# Patient Record
Sex: Male | Born: 1975 | Race: White | Hispanic: No | Marital: Single
Health system: Southern US, Community
[De-identification: ages and names within clinical notes are randomized; demographics above are authoritative.]

---

## 2019-06-09 ENCOUNTER — Other Ambulatory Visit: Payer: Self-pay | Admitting: Family Medicine

## 2019-06-09 DIAGNOSIS — K651 Peritoneal abscess: Secondary | ICD-10-CM

## 2019-06-22 ENCOUNTER — Ambulatory Visit
Admission: RE | Admit: 2019-06-22 | Discharge: 2019-06-22 | Disposition: A | Discharge status: Home / Self Care | Payer: No Typology Code available for payment source | Source: Ambulatory Visit | Attending: Family Medicine | Admitting: Family Medicine

## 2019-06-22 ENCOUNTER — Encounter: Payer: Self-pay | Admitting: Radiology

## 2019-06-22 ENCOUNTER — Other Ambulatory Visit: Payer: Self-pay | Admitting: Family Medicine

## 2019-06-22 DIAGNOSIS — K651 Peritoneal abscess: Secondary | ICD-10-CM

## 2019-06-22 HISTORY — PX: IR RADIOLOGIST EVAL & MGMT: IMG5224

## 2019-06-22 MED ORDER — IOPAMIDOL (ISOVUE-300) INJECTION 61%
125.0000 mL | Freq: Once | INTRAVENOUS | Status: AC | PRN
  Administered 2019-06-22: 12:00:00 125 mL via INTRAVENOUS

## 2019-06-22 NOTE — Progress Notes (Signed)
Chief Complaint: Patient was seen in consultation today for abscess follow up at the request of McRoberts,Deborah  Referring Physician(s): McRoberts,Deborah  History of Present Illness: Randall Neal is a 43 y.o. male with diverticular abscess s/p perc drain placement a couple weeks ago at [facility]. He has been keeping track of his output, which has only been about 5 mL per day. He finishes his abx today. Denies fevers, chills, abd pain. States has good appetite and good BMs. He is here today for follow up CT and drain injection.  No past medical history on file.   Allergies: Patient has no allergy information on record.  Medications: Prior to Admission medications   Not on File     No family history on file.  Social History   Socioeconomic History  . Marital status: Single    Spouse name: Not on file  . Number of children: Not on file  . Years of education: Not on file  . Highest education level: Not on file  Occupational History  . Not on file  Social Needs  . Financial resource strain: Not on file  . Food insecurity    Worry: Not on file    Inability: Not on file  . Transportation needs    Medical: Not on file    Non-medical: Not on file  Tobacco Use  . Smoking status: Not on file  Substance and Sexual Activity  . Alcohol use: Not on file  . Drug use: Not on file  . Sexual activity: Not on file  Lifestyle  . Physical activity    Days per week: Not on file    Minutes per session: Not on file  . Stress: Not on file  Relationships  . Social Musicianconnections    Talks on phone: Not on file    Gets together: Not on file    Attends religious service: Not on file    Active member of club or organization: Not on file    Attends meetings of clubs or organizations: Not on file    Relationship status: Not on file  Other Topics Concern  . Not on file  Social History Narrative  . Not on file     Review of Systems: A 12 point ROS discussed and pertinent  positives are indicated in the HPI above.  All other systems are negative.  Review of Systems  Vital Signs: There were no vitals taken for this visit.  Physical Exam HENT:     Head: Normocephalic.  Cardiovascular:     Rate and Rhythm: Normal rate and regular rhythm.     Heart sounds: Normal heart sounds.  Pulmonary:     Effort: Pulmonary effort is normal. No respiratory distress.     Breath sounds: Normal breath sounds.  Abdominal:     General: Abdomen is flat.     Palpations: Abdomen is soft.     Comments: RLQ drain intact, site clean, dry  Neurological:     Mental Status: He is alert.  Psychiatric:        Mood and Affect: Mood normal.        Judgment: Judgment normal.      Imaging:  CT shows essential resolution of abscess cavity, see full report. 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 Drain needs to remain. Switch to gravity bag instead of suction. Continue once daily gentle flush 5 mL  only. Will set him up for 2 week follow up drain injection only. Does not need repeat CT unless something changes clinically. He needs to see his PCP who can refer him for both GI and Surgery consults.  Thank you for this interesting consult.  I greatly enjoyed meeting Randall Neal and look forward to participating in their care.  A copy of this report was sent to the requesting provider on this date.  Electronically Signed: Ascencion Dike 06/22/2019, 1:33 PM   I spent a total of 25 minutes in face to face in clinical consultation, greater than 50% of which was counseling/coordinating care for abscess drain

## 2019-07-06 ENCOUNTER — Other Ambulatory Visit: Payer: Self-pay | Admitting: Family Medicine

## 2019-07-06 ENCOUNTER — Ambulatory Visit
Admission: RE | Admit: 2019-07-06 | Discharge: 2019-07-06 | Disposition: A | Discharge status: Home / Self Care | Payer: No Typology Code available for payment source | Source: Ambulatory Visit | Attending: Family Medicine | Admitting: Family Medicine

## 2019-07-06 ENCOUNTER — Encounter: Payer: Self-pay | Admitting: Radiology

## 2019-07-06 DIAGNOSIS — K651 Peritoneal abscess: Secondary | ICD-10-CM

## 2019-07-06 HISTORY — PX: IR RADIOLOGIST EVAL & MGMT: IMG5224

## 2019-07-06 NOTE — Progress Notes (Signed)
Referring Physician(s): McRoberts,Deborah Dr Forrest MoronStephen Ruehle  Chief Complaint: The patient is seen in follow up today s/p 06/07/19:  Successful CT-guided diverticular abscess drain placement   History of present illness:  Lower abdominal diverticular abscess Abscess drain in place  9/1 injection: Fistula from the level of the percutaneous drainage catheter to the lumen of the mid sigmoid colon.  OP very little; odorous yellow fluid flushes 2x/day-- 5 cc each time Denies N/V Denies fever/chills Denies pain Eating well  Pt seeing Dr Noah DelaineMcRoberts and Dr Levora Angeluehle Has not been evaluated by GI or Surgeon at this point  Scheduled today for injection only And plan for management  No past medical history on file.  Allergies: Patient has no allergy information on record.  Medications: Prior to Admission medications   Not on File     No family history on file.  Social History   Socioeconomic History  . Marital status: Single    Spouse name: Not on file  . Number of children: Not on file  . Years of education: Not on file  . Highest education level: Not on file  Occupational History  . Not on file  Social Needs  . Financial resource strain: Not on file  . Food insecurity    Worry: Not on file    Inability: Not on file  . Transportation needs    Medical: Not on file    Non-medical: Not on file  Tobacco Use  . Smoking status: Not on file  Substance and Sexual Activity  . Alcohol use: Not on file  . Drug use: Not on file  . Sexual activity: Not on file  Lifestyle  . Physical activity    Days per week: Not on file    Minutes per session: Not on file  . Stress: Not on file  Relationships  . Social Musicianconnections    Talks on phone: Not on file    Gets together: Not on file    Attends religious service: Not on file    Active member of club or organization: Not on file    Attends meetings of clubs or organizations: Not on file    Relationship status: Not on file   Other Topics Concern  . Not on file  Social History Narrative  . Not on file     Vital Signs: BP (!) 148/77   Pulse (!) 104   Temp 98.2 F (36.8 C)   SpO2 100%    Physical Exam Vitals signs reviewed.  Skin:    General: Skin is warm and dry.     Comments: Site is clean and dry NT no bleeding Injection reveals ++ fistula to bowel per Dr Loreta AveWagner  Neurological:     Mental Status: He is alert.   Imaging: Ir Radiologist Eval & Mgmt  Result Date: 07/06/2019 Please refer to notes tab for details about interventional procedure. (Op Note)   Labs:  CBC: No results for input(s): WBC, HGB, HCT, PLT in the last 8760 hours.  COAGS: No results for input(s): INR, APTT in the last 8760 hours.  BMP: No results for input(s): NA, K, CL, CO2, GLUCOSE, BUN, CALCIUM, CREATININE, GFRNONAA, GFRAA in the last 8760 hours.  Invalid input(s): CMP  LIVER FUNCTION TESTS: No results for input(s): BILITOT, AST, ALT, ALKPHOS, PROT, ALBUMIN in the last 8760 hours.  Assessment:  Diverticular abscess Injection today does reveal + fistula to bowel Plan: stop flushes Keep drain to gravity bag Need to get referral for GI evaluation  and Surgery evaluation Will send note to Dr Jabier Mutton and Dr Clyde Lundborg Pt has good understanding of plan Dr Earleen Newport spent several minutes with pt in explanation  Return 4 weeks for Injection  Signed: Lavonia Drafts, PA-C 07/06/2019, 2:55 PM   Please refer to Dr. Earleen Newport attestation of this note for management and plan.

## 2019-08-03 ENCOUNTER — Encounter: Payer: Self-pay | Admitting: Radiology

## 2019-08-03 ENCOUNTER — Ambulatory Visit
Admission: RE | Admit: 2019-08-03 | Discharge: 2019-08-03 | Disposition: A | Discharge status: Home / Self Care | Payer: No Typology Code available for payment source | Source: Ambulatory Visit | Attending: Family Medicine | Admitting: Family Medicine

## 2019-08-03 DIAGNOSIS — K651 Peritoneal abscess: Secondary | ICD-10-CM

## 2019-08-03 HISTORY — PX: IR RADIOLOGIST EVAL & MGMT: IMG5224

## 2019-08-03 NOTE — Progress Notes (Signed)
Chief Complaint: Status post percutaneous catheter drainage of sigmoid diverticular abscess.  History of Present Illness: Randall Neal is a 43 y.o. male status post percutaneous catheter drainage of sigmoid diverticular abscess on 06/07/2019.  Since that time 2 separate fluoroscopic contrast injection procedures have demonstrated a persistent fistula to the lumen of the adjacent sigmoid colon.  He did have some pain recently and had a CT of the abdomen and pelvis performed on 07/31/2019 at [facility] demonstrating resolution of the abscess and no new acute findings with stable positioning of the percutaneous drain.  Pain has since resolved.  Randall Neal is on a normal diet and having normal bowel movements.  He denies fever.  He is no longer on antibiotics.  Output from the drain is approximately 3 mL of fairly clear fluid every 1 to 2 days.  The drainage catheter is to a gravity bag and is not being flushed since his last drain injection 4 weeks ago.  He never saw a general surgeon in follow-up after discharge from the hospital.  He is scheduled to see Dr. Conley Rolls tomorrow for initial GI consultation.  No past medical history on file.   Allergies: Patient has no allergy information on record.  Medications: Prior to Admission medications   Not on File     No family history on file.  Social History   Socioeconomic History   Marital status: Single    Spouse name: Not on file   Number of children: Not on file   Years of education: Not on file   Highest education level: Not on file  Occupational History   Not on file  Social Needs   Financial resource strain: Not on file   Food insecurity    Worry: Not on file    Inability: Not on file   Transportation needs    Medical: Not on file    Non-medical: Not on file  Tobacco Use   Smoking status: Not on file  Substance and Sexual Activity   Alcohol use: Not on file   Drug use: Not on file   Sexual activity: Not on  file  Lifestyle   Physical activity    Days per week: Not on file    Minutes per session: Not on file   Stress: Not on file  Relationships   Social connections    Talks on phone: Not on file    Gets together: Not on file    Attends religious service: Not on file    Active member of club or organization: Not on file    Attends meetings of clubs or organizations: Not on file    Relationship status: Not on file  Other Topics Concern   Not on file  Social History Narrative   Not on file    Review of Systems: A 12 point ROS discussed and pertinent positives are indicated in the HPI above.  All other systems are negative.  Review of Systems  Constitutional: Negative.   Respiratory: Negative.   Cardiovascular: Negative.   Gastrointestinal: Negative.   Genitourinary: Negative.   Musculoskeletal: Negative.   Neurological: Negative.     Vital Signs: BP 125/83    Pulse (!) 101    Temp 98.7 F (37.1 C)    SpO2 99%   Physical Exam Vitals signs reviewed.  Constitutional:      General: He is not in acute distress.    Appearance: Normal appearance. He is not ill-appearing, toxic-appearing or diaphoretic.  Abdominal:  General: There is no distension.     Palpations: Abdomen is soft.     Tenderness: There is no abdominal tenderness.  Neurological:     Mental Status: He is alert and oriented to person, place, and time.     Imaging: Dg Sinus/fist Tube Chk-non Gi  Result Date: 08/03/2019 INDICATION: Sigmoid diverticulitis with percutaneous catheter drainage of diverticular abscess on 06/07/2019. There has been a persistent fistula demonstrated to the sigmoid colon with drainage catheter injection. EXAM: INJECTION OF PERCUTANEOUS ABSCESS DRAINAGE CATHETER UNDER FLUOROSCOPY MEDICATIONS: None ANESTHESIA/SEDATION: None CONTRAST:  6 mL Omnipaque 300 FLUOROSCOPY TIME:  30 seconds.  27.1 mGy. COMPLICATIONS: None immediate. PROCEDURE: Contrast was injected via the pre-existing  percutaneous drainage catheter. Fluoroscopic images were saved. FINDINGS: There is no residual abscess cavity. Injection of the drain demonstrates no evidence of persistent fistula to the lumen of the adjacent sigmoid colon. IMPRESSION: No further fistula is demonstrated to the lumen of the sigmoid colon. Following the drain injection, the drainage catheter was removed in its entirety. See separate dictated clinic note. Electronically Signed   By: Aletta Edouard M.D.   On: 08/03/2019 12:56   Dg Sinus/fist Tube Chk-non Gi  Result Date: 07/06/2019 INDICATION: 43 year old male with a history of diverticular abscess. EXAM: DRAIN INJECTION MEDICATIONS: None ANESTHESIA/SEDATION: None COMPLICATIONS: None PROCEDURE: Informed written consent was obtained from the patient after a thorough discussion of the procedural risks, benefits and alternatives. All questions were addressed. Maximal Sterile Barrier Technique was utilized including caps, mask, sterile gowns, sterile gloves, sterile drape, hand hygiene and skin antiseptic. A timeout was performed prior to the initiation of the procedure. Patient positioned supine position on the fluoroscopy table. Scout images were acquired. Gentle contrast injection was performed. Contrast immediately filled a small fistula to the colon. No residual abscess cavity. Catheter was attached to gravity drainage. Patient tolerated the procedure well and remained hemodynamically stable throughout. No complications were encountered and no significant blood loss FINDINGS: Injection through the existing catheter demonstrates no residual abscess with small fistula to the colon. IMPRESSION: Contrast injection through the indwelling catheter confirms no residual abscess with a small fistula to the colon. Electronically Signed   By: Corrie Mckusick D.O.   On: 07/06/2019 16:43   Ir Radiologist Eval & Mgmt  Result Date: 08/03/2019 Please refer to notes tab for details about interventional  procedure. (Op Note)  Ir Radiologist Eval & Mgmt  Result Date: 07/06/2019 Please refer to notes tab for details about interventional procedure. (Op Note)   Assessment and Plan:  The midline lower abdominal drainage catheter was injected today under fluoroscopy with contrast.  No further fistula is demonstrated to the sigmoid colon.  Given minimal output as well as recent CT demonstrating no abscess or acute findings, the drain was removed today.  The entire drain was removed without difficulty.  A dressing was applied over the drain exit site and the patient given wound care instructions.  He will see Dr. Marin Comment tomorrow for gastroenterology follow-up.  Electronically Signed: Azzie Roup 08/03/2019, 12:57 PM     I spent a total of 10 Minutes in face to face in clinical consultation, greater than 50% of which was counseling/coordinating care for sigmoid diverticular abscess drain management.

## 2019-09-23 IMAGING — RF DG SINUS / FISTULA TRACT / ABSCESSOGRAM
4 series · 10 of 10 positions shown · non-contrast
Comparison: none

INDICATION: Status post percutaneous catheter drainage of sigmoid diverticular
abscess on 06/07/2019.

[Series 1: one shot · 1 of 1 slices shown (1 of 2)]
[im 1/1]
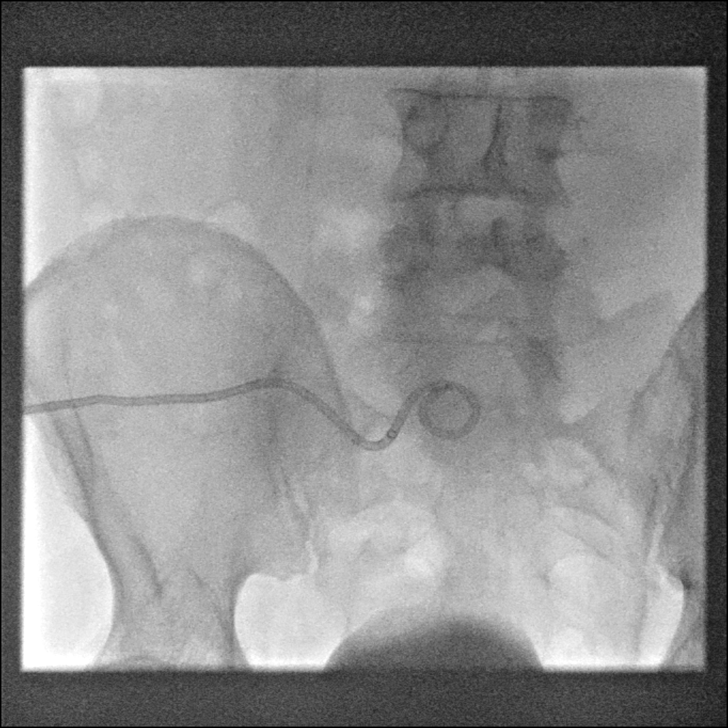

[Series 2: sequence · 4 of 47 frames shown (1 of 2)]
[frame 8/47]
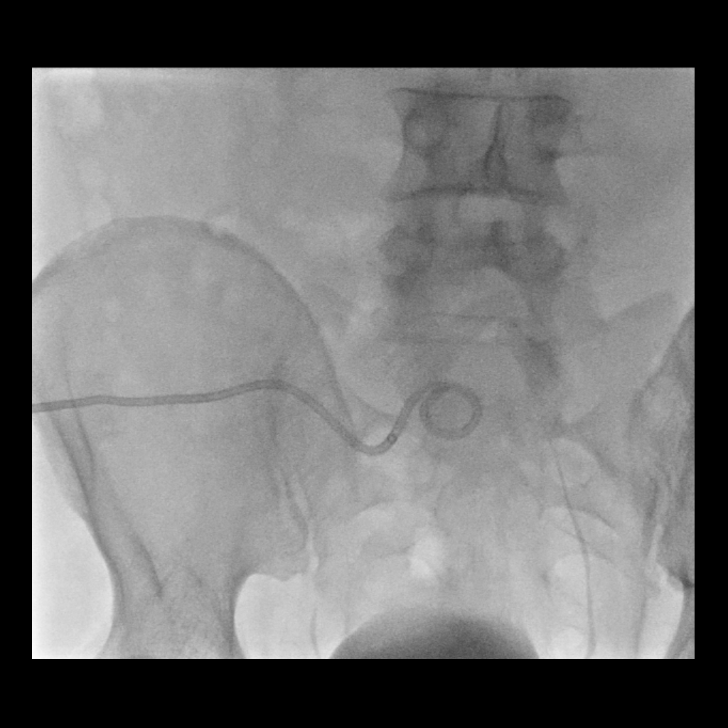
[frame 24/47]
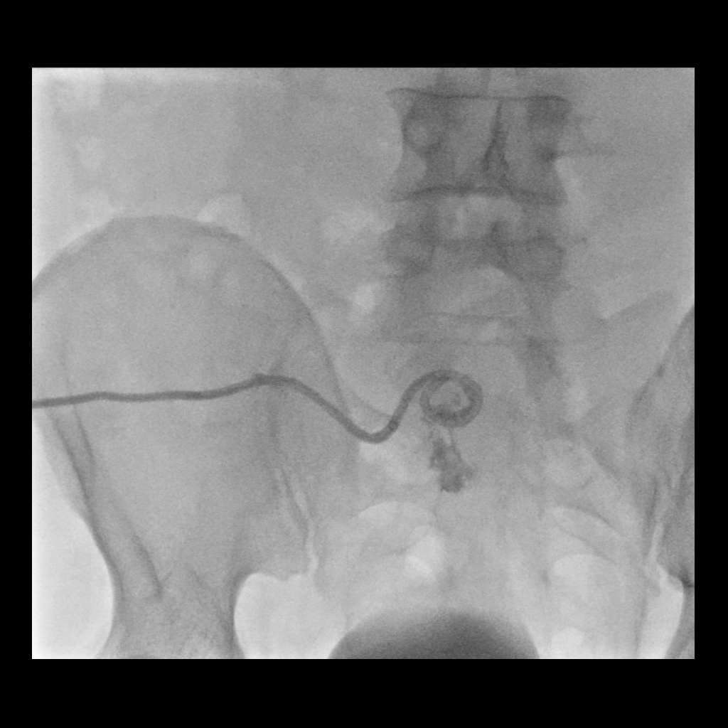
[frame 36/47]
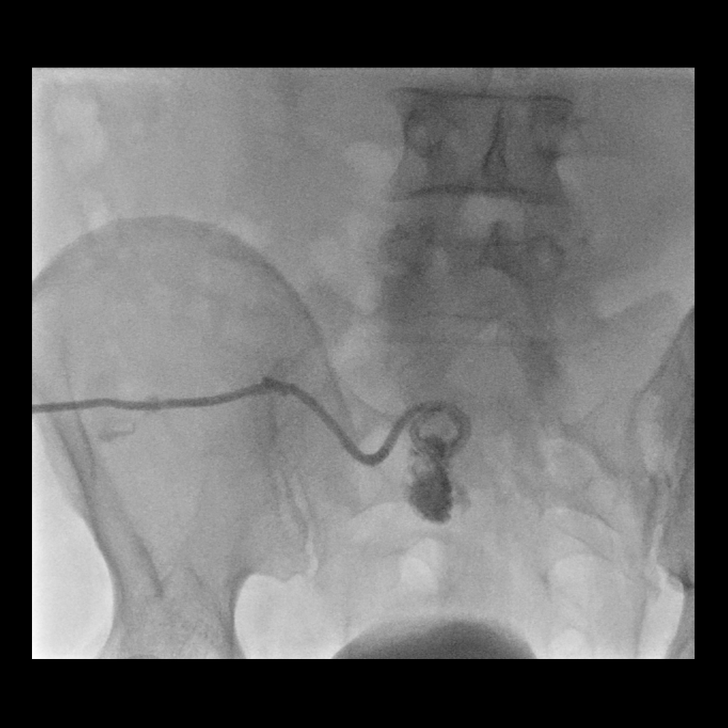
[frame 40/47]
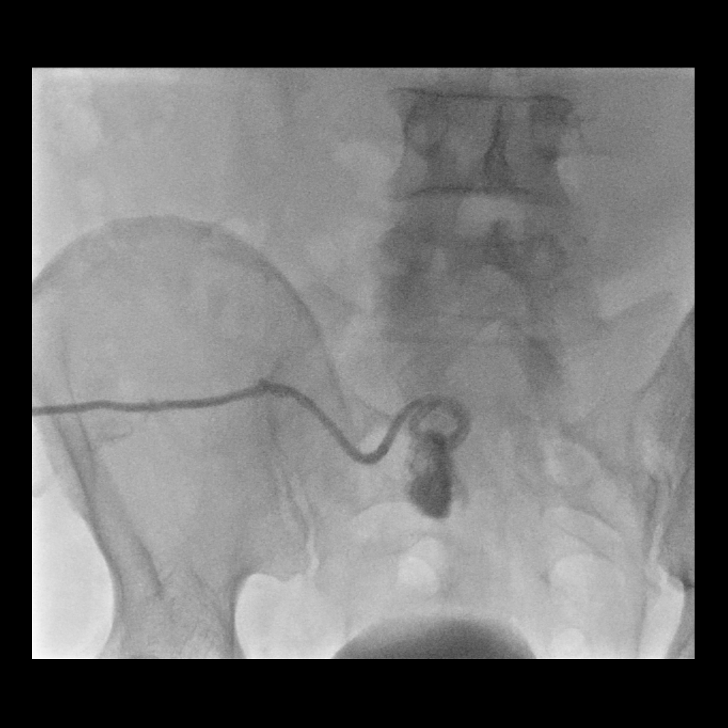

[Series 3: sequence · 4 of 44 frames shown (2 of 2)]
[frame 7/44]
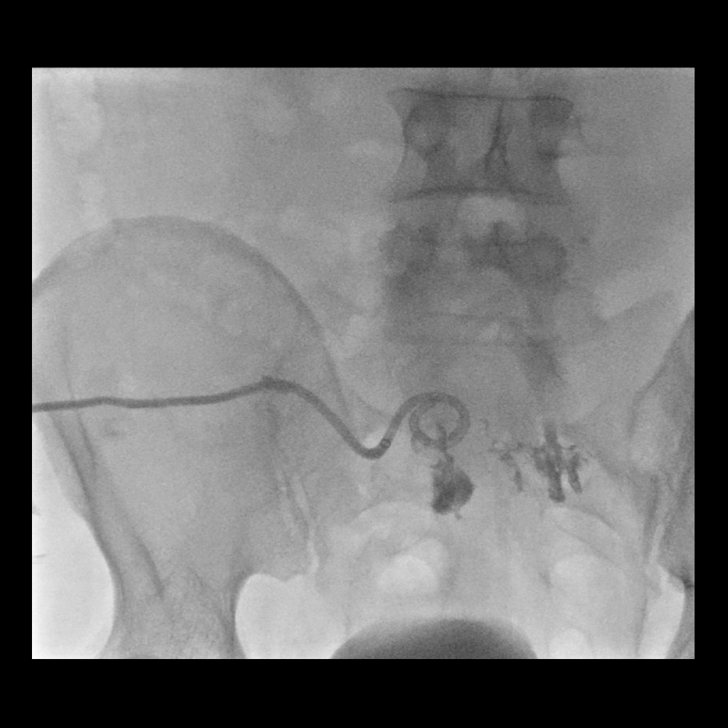
[frame 23/44]
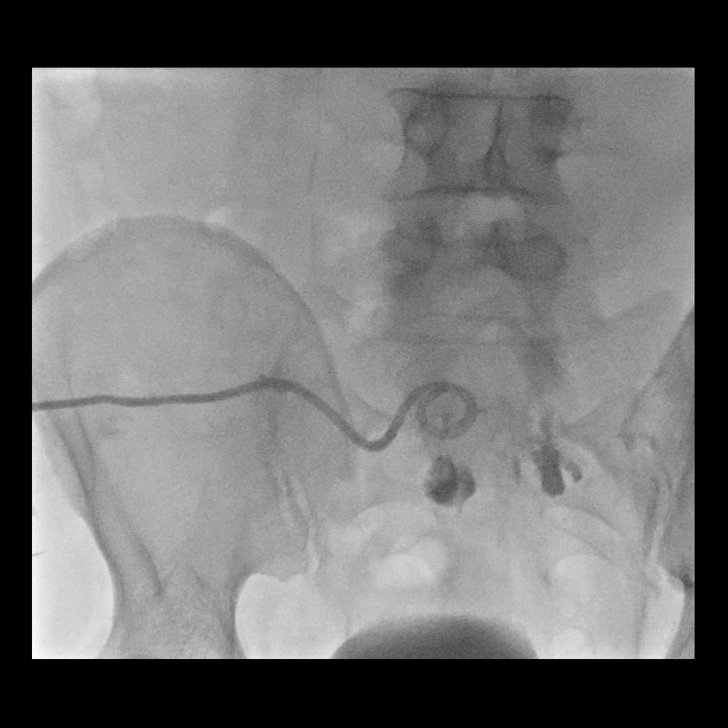
[frame 27/44]
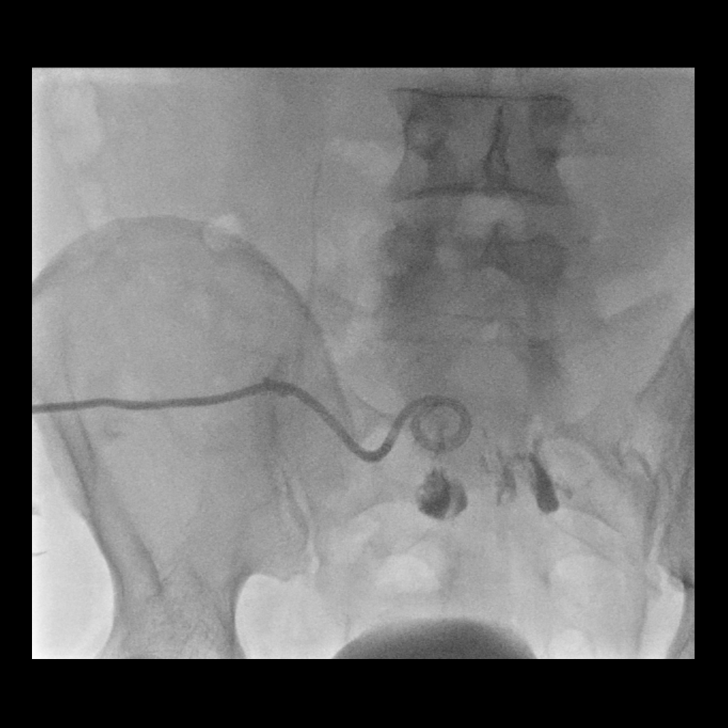
[frame 38/44]
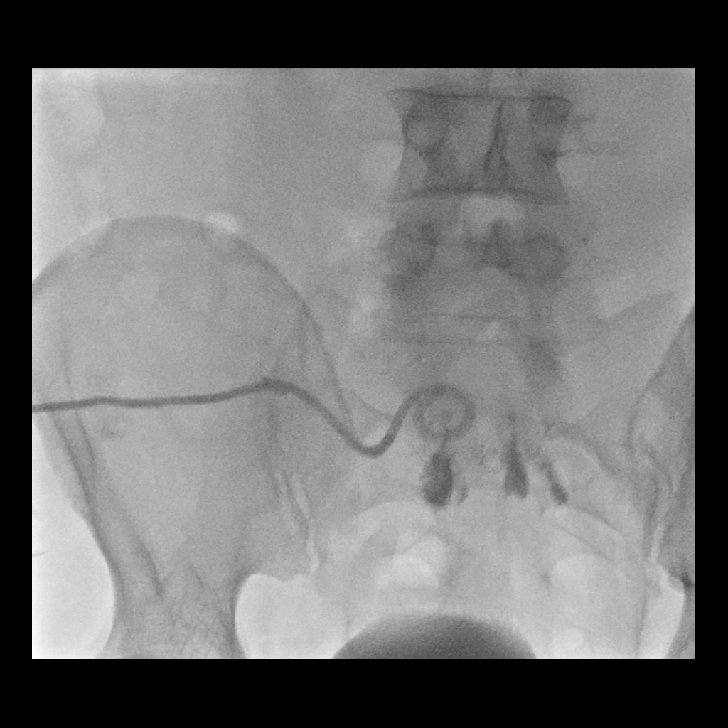

[Series 4: one shot · 1 of 1 slices shown (2 of 2)]
[im 1/1]
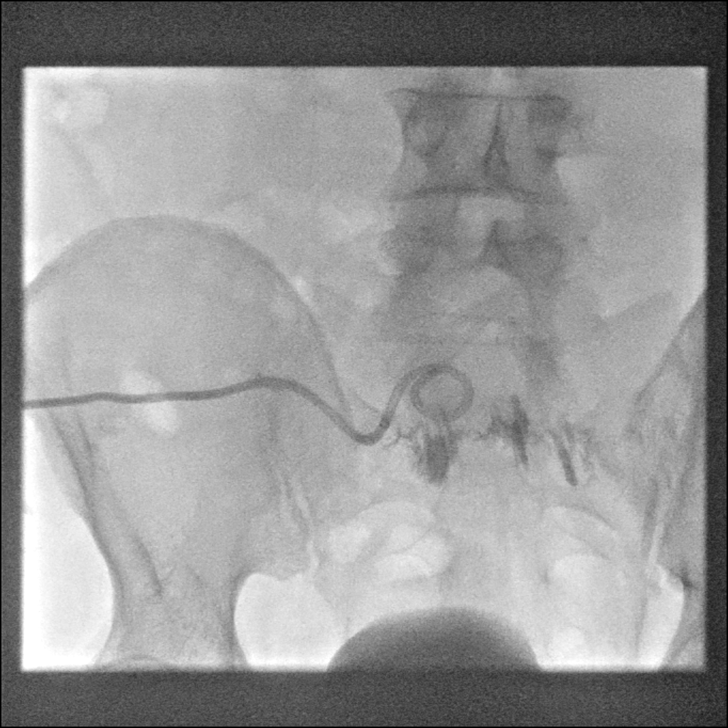

[10 of 10 positions shown; findings below may reference images not displayed]

EXAM:
INJECTION PERCUTANEOUS ABSCESS DRAINAGE CATHETER UNDER FLUOROSCOPY

MEDICATIONS:
None

ANESTHESIA/SEDATION:
None

FLUOROSCOPY TIME:  36 seconds.  80 mGy.

CONTRAST:  10 mL Omnipaque 300

COMPLICATIONS:
None immediate.

PROCEDURE:
The pre-existing percutaneous drain was injected with contrast
material under fluoroscopy. Multiple fluoroscopic images were saved.
The catheter was then flushed with saline and connected to a gravity
drainage bag.
FINDINGS: With injection of the drainage catheter, no significant residual
abscess cavity is present. There is immediate filling of a thin
fistula extending inferiorly directly into the lumen of the adjacent
mid sigmoid colon.
IMPRESSION: Fistula from the level of the percutaneous drainage catheter to the
lumen of the mid sigmoid colon.

## 2019-11-04 IMAGING — RF DG SINUS / FISTULA TRACT / ABSCESSOGRAM
1 series · 3 of 3 positions shown · non-contrast
Comparison: none

INDICATION: Sigmoid diverticulitis with percutaneous catheter drainage of
diverticular abscess on 06/07/2019. There has been a persistent
fistula demonstrated to the sigmoid colon with drainage catheter
injection.

[Series 1: sequence · 3 of 79 frames shown]
[frame 12/79]
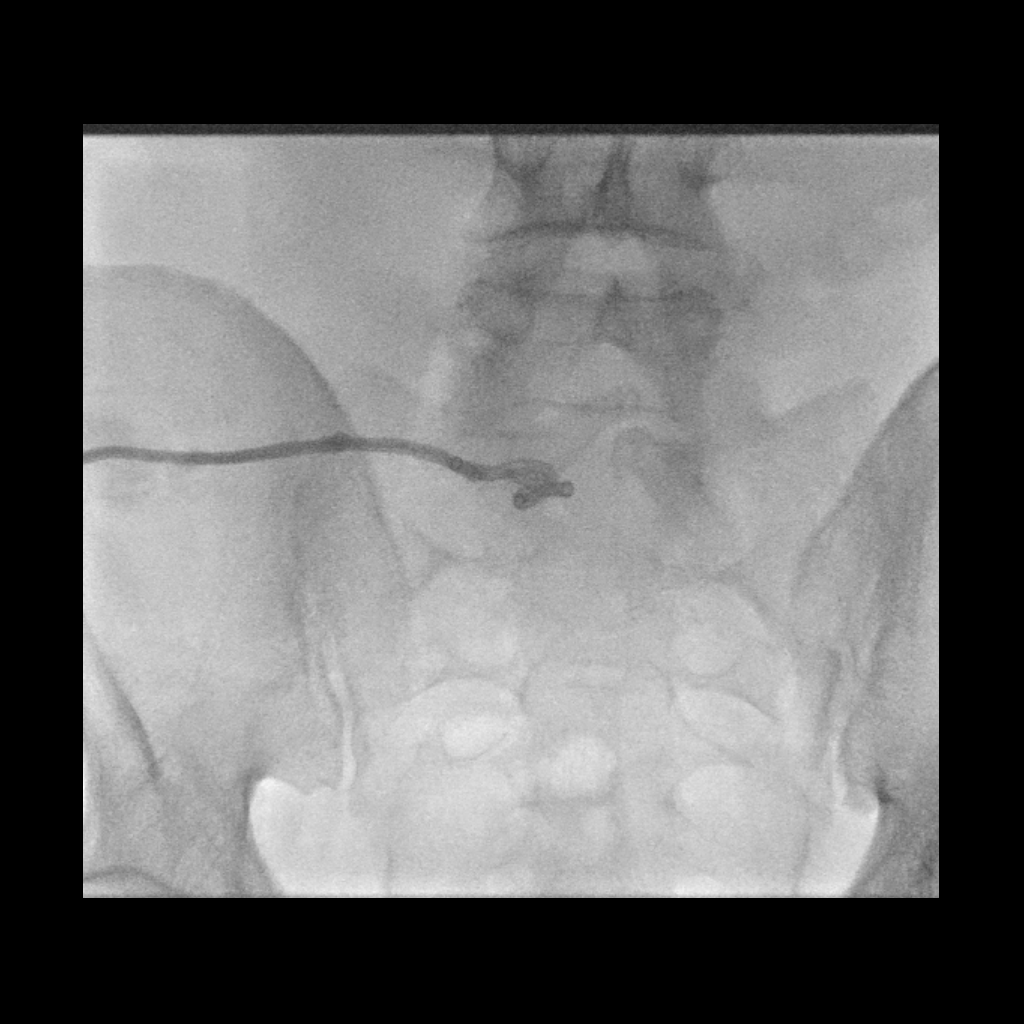
[frame 40/79]
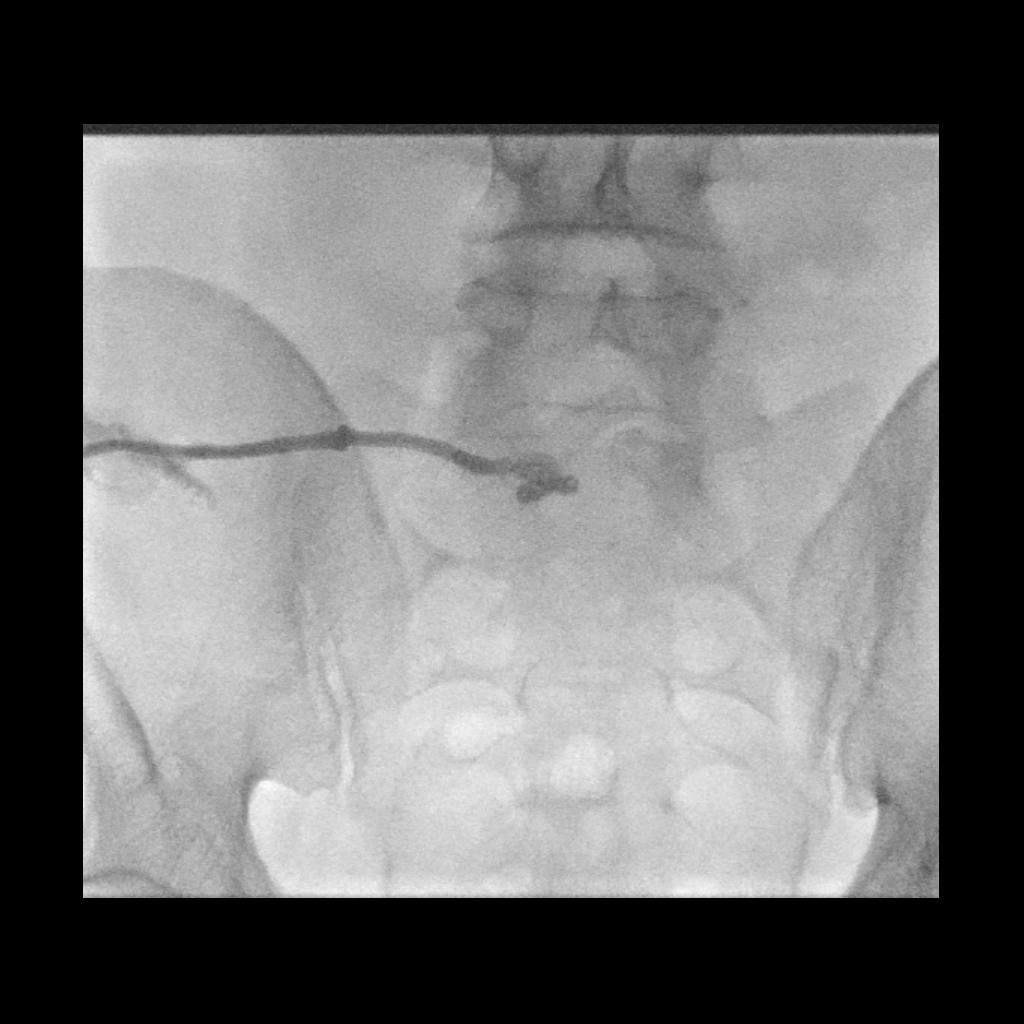
[frame 68/79]
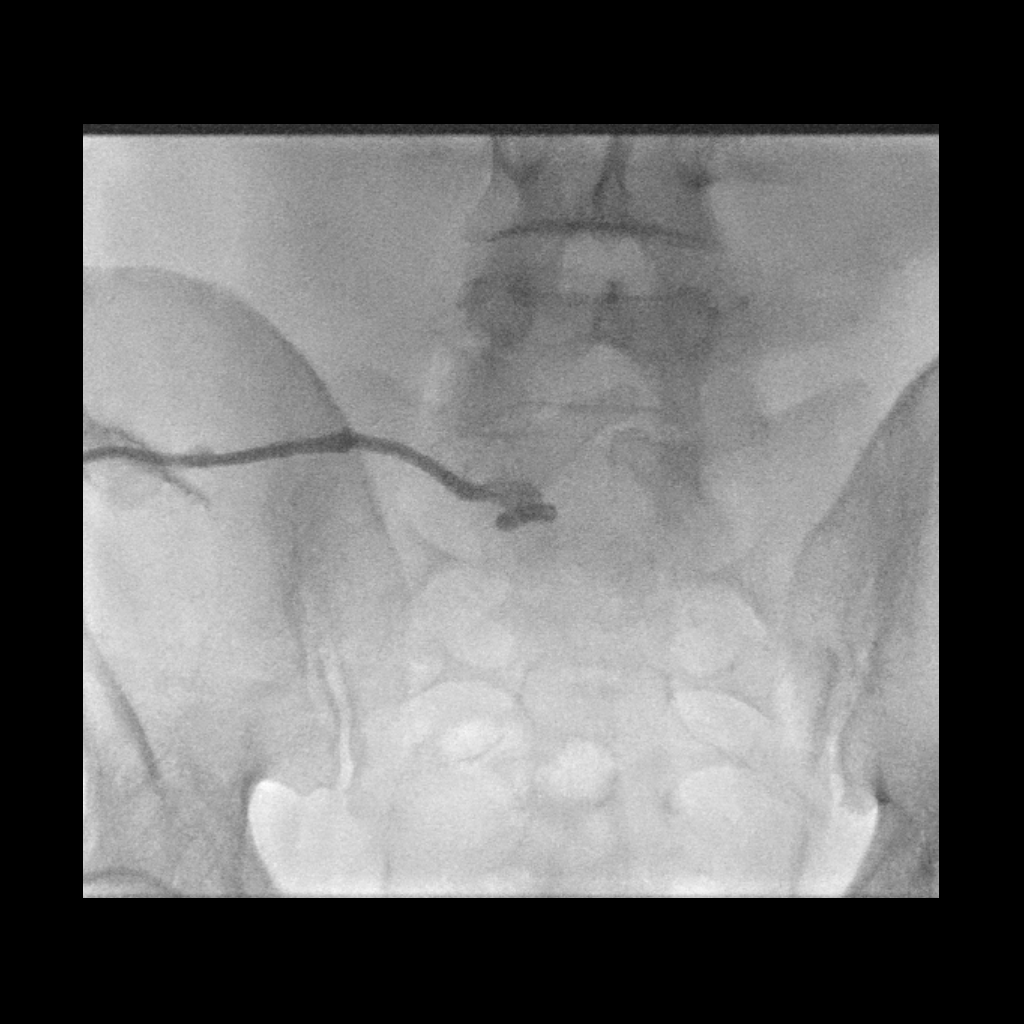

[3 of 3 positions shown; findings below may reference images not displayed]

EXAM:
INJECTION OF PERCUTANEOUS ABSCESS DRAINAGE CATHETER UNDER
FLUOROSCOPY

MEDICATIONS:
None

ANESTHESIA/SEDATION:
None

CONTRAST:  6 mL Omnipaque 300

FLUOROSCOPY TIME:  30 seconds.  27.1 mGy.

COMPLICATIONS:
None immediate.

PROCEDURE:
Contrast was injected via the pre-existing percutaneous drainage
catheter. Fluoroscopic images were saved.
FINDINGS: There is no residual abscess cavity. Injection of the drain
demonstrates no evidence of persistent fistula to the lumen of the
adjacent sigmoid colon.
IMPRESSION: No further fistula is demonstrated to the lumen of the sigmoid
colon. Following the drain injection, the drainage catheter was
removed in its entirety. See separate dictated clinic note.
